# Patient Record
Sex: Female | Born: 1985 | Race: Black or African American | Hispanic: No | Marital: Married | Smoking: Never smoker
Health system: Southern US, Community
[De-identification: ages and names within clinical notes are randomized; demographics above are authoritative.]

## PROBLEM LIST (undated history)

## (undated) DIAGNOSIS — H9190 Unspecified hearing loss, unspecified ear: Secondary | ICD-10-CM

---
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
## 2008-04-17 ENCOUNTER — Encounter: Admission: RE | Admit: 2008-04-17 | Discharge: 2008-04-17 | Payer: Self-pay | Admitting: Family Medicine

## 2008-11-19 ENCOUNTER — Encounter: Admission: RE | Admit: 2008-11-19 | Discharge: 2008-11-19 | Payer: Self-pay | Admitting: Family Medicine

## 2010-06-29 IMAGING — US US SOFT TISSUE HEAD/NECK
1 series · 14 of 25 positions shown · non-contrast
Comparison: [HOSPITAL] chest x-ray 05/10/2003 and

CLINICAL DATA: Thyroid enlargement on physical exam.

THYROID ULTRASOUND
TECHNIQUE: Ultrasound examination of the thyroid gland and
adjacent soft tissues was performed.

[Series 1: us soft tissue head/neck · 0.05mm/px · 14 of 34 slices shown]
[im 1/34]
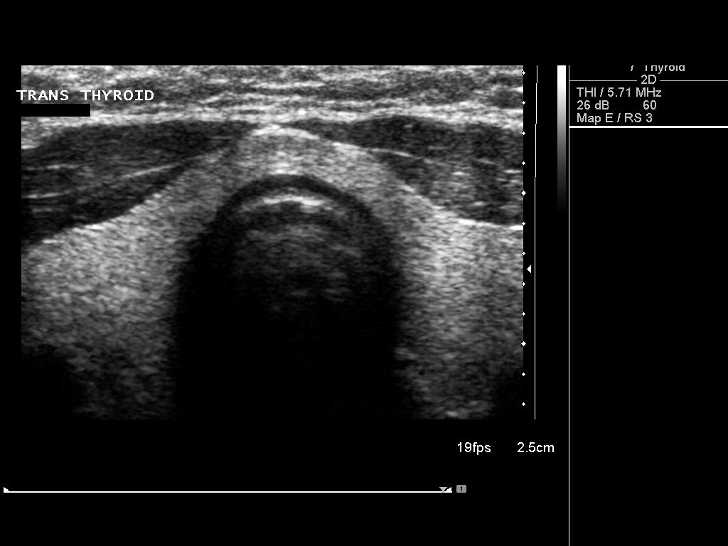
[im 3/34]
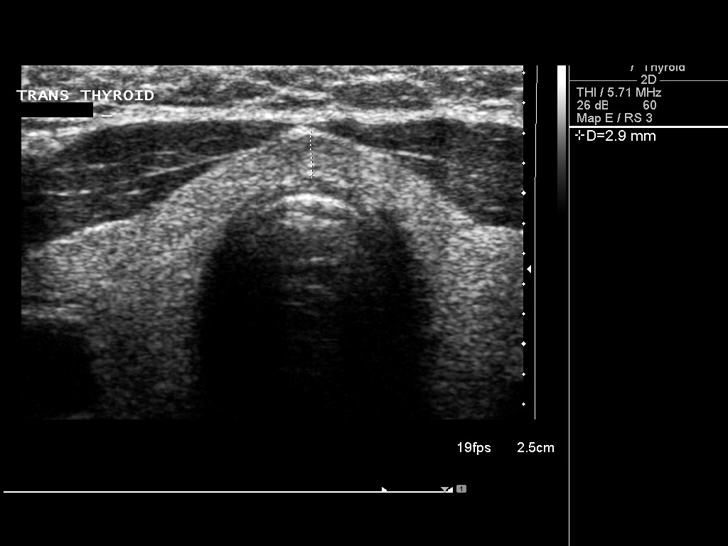
[im 6/34]
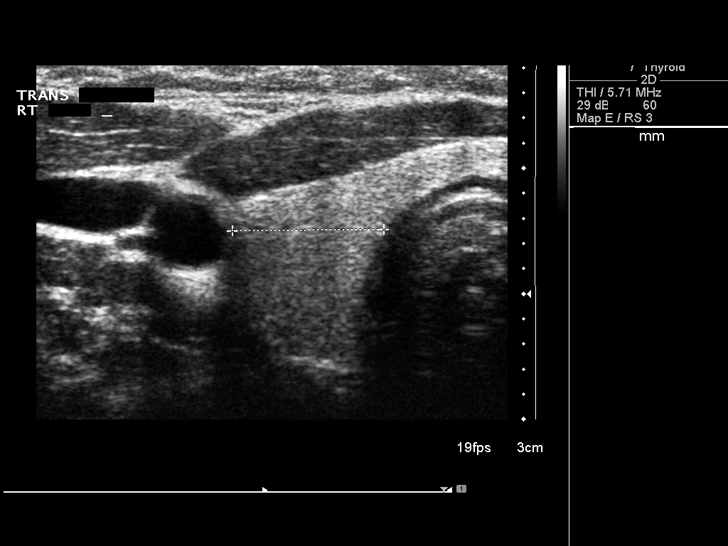
[im 9/34]
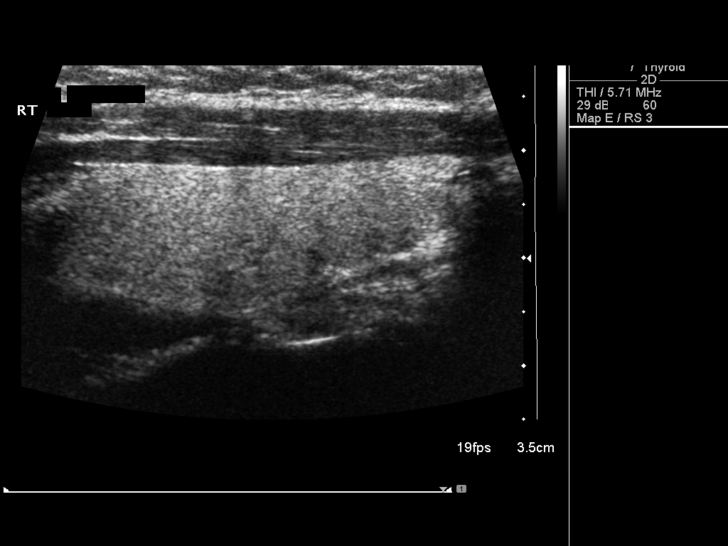
[im 12/34]
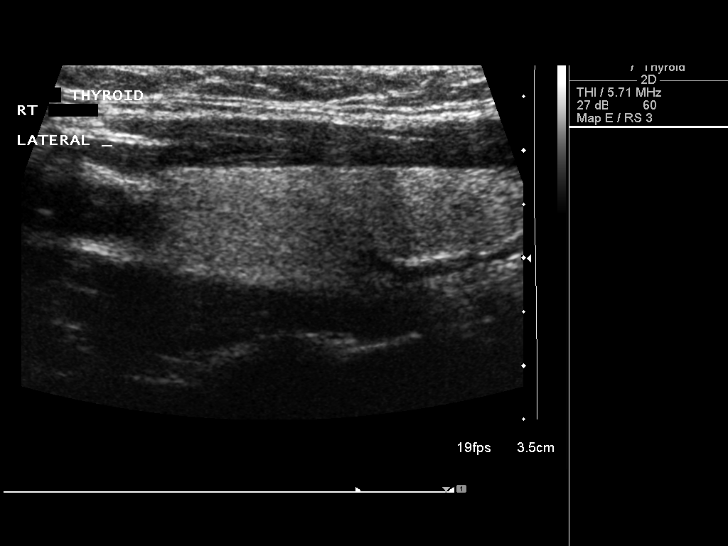
[im 13/34]
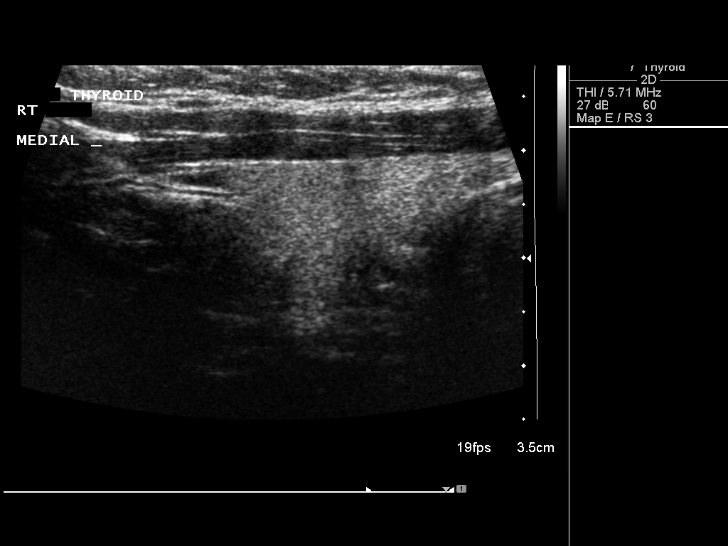
[im 16/34]
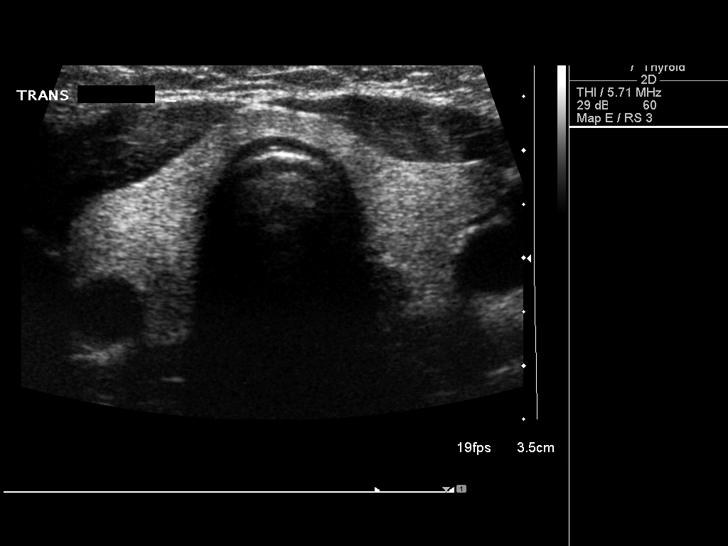
[im 18/34]
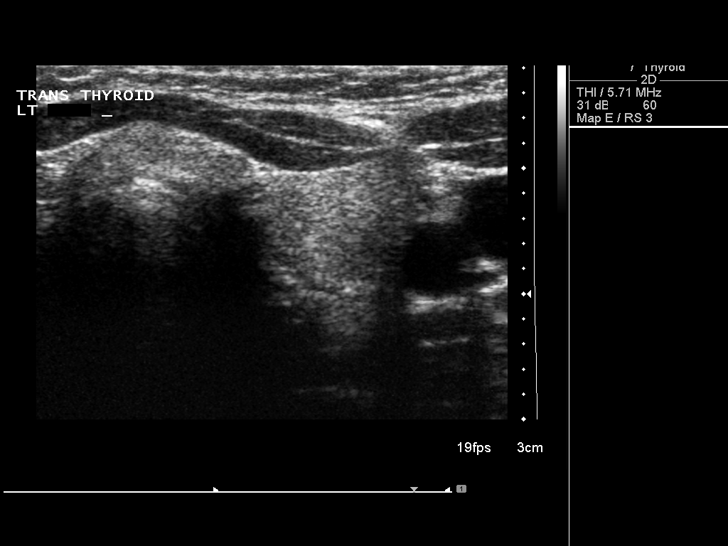
[im 21/34]
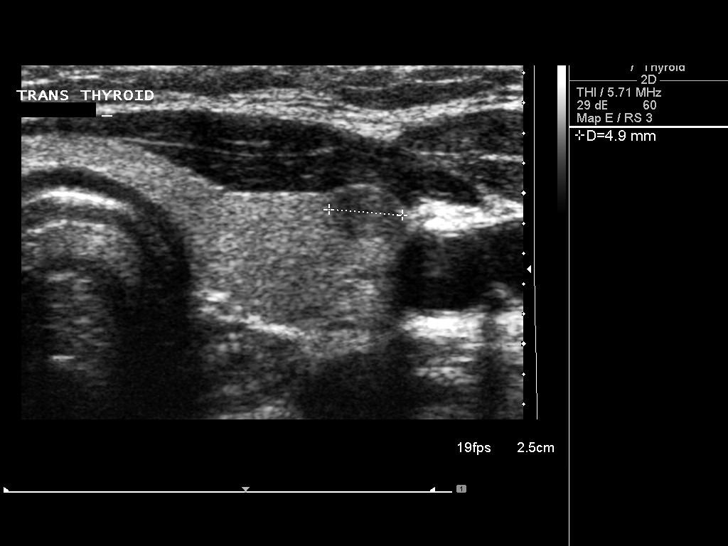
[im 23/34]
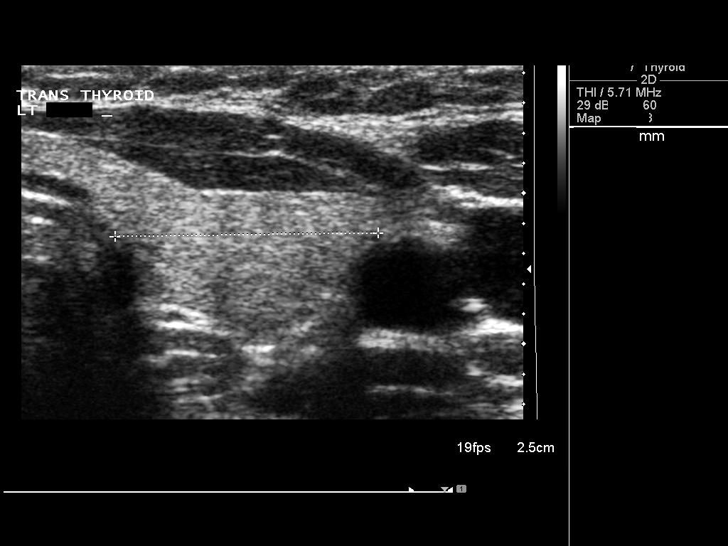
[im 25/34]
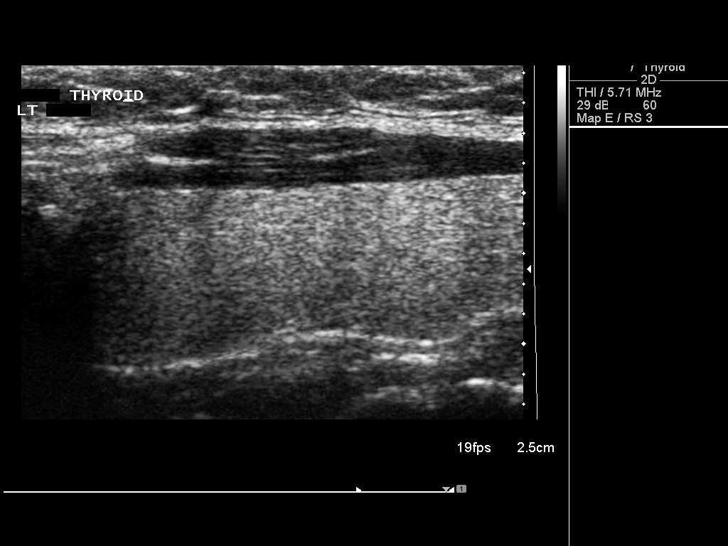
[im 28/34]
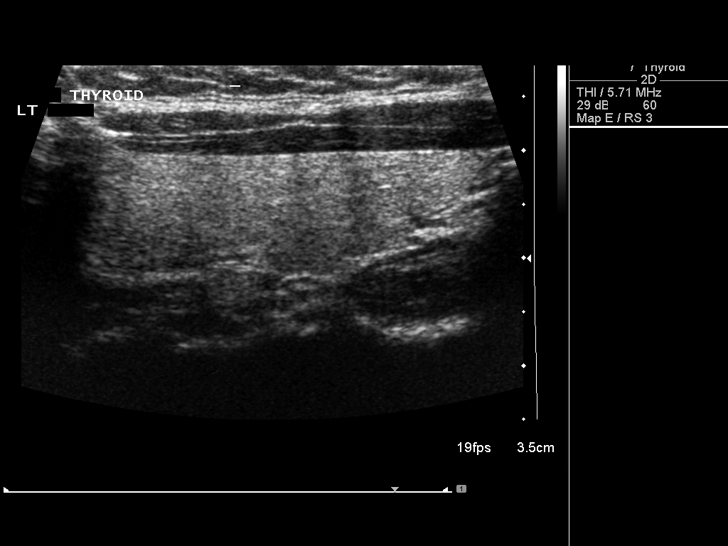
[im 31/34]
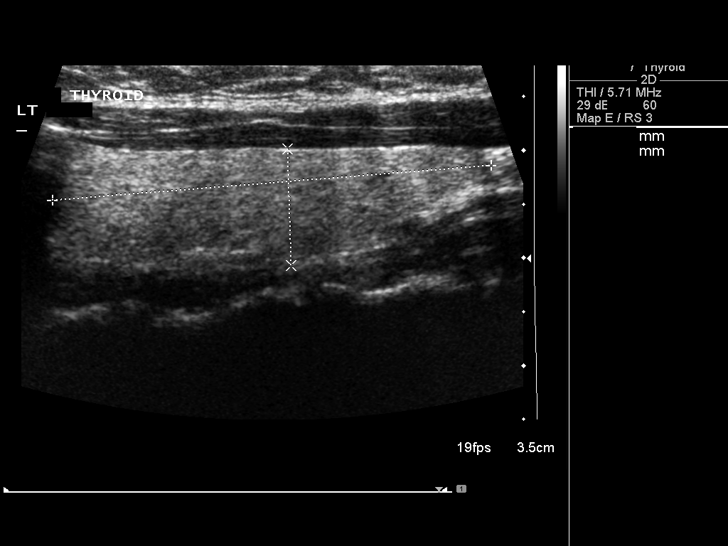
[im 34/34]
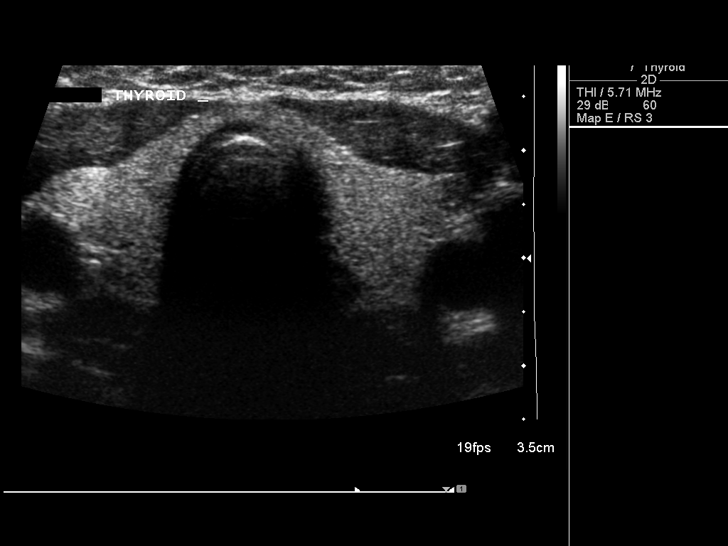

[14 of 25 positions shown; findings below may reference images not displayed]

FINDINGS: Thyroid gland is normal in size with right lobe measuring
4.4 cm long X 1.4 cm AP X 1.2 cm wide and left lobe 4.2 cm long X
1.1 cm AP X 1.4 cm wide.  Isthmus measures 3 mm AP thickness.
Thyroid echotexture is homogeneous.  At the mid left thyroid lobe
is solitary hypoechoic 5 mm nodule not visualized on previous neck
CT.
IMPRESSION: 1.  Mid left lobe 5 mm solid solitary nodule - probable benign
thyroid adenoma.
2.  Otherwise, normal.

## 2013-01-20 ENCOUNTER — Emergency Department (INDEPENDENT_AMBULATORY_CARE_PROVIDER_SITE_OTHER)
Admission: EM | Admit: 2013-01-20 | Discharge: 2013-01-20 | Disposition: A | Discharge status: Home / Self Care | Payer: Self-pay | Source: Home / Self Care | Attending: Family Medicine | Admitting: Family Medicine

## 2013-01-20 ENCOUNTER — Encounter (HOSPITAL_COMMUNITY): Payer: Self-pay | Admitting: Emergency Medicine

## 2013-01-20 DIAGNOSIS — R079 Chest pain, unspecified: Secondary | ICD-10-CM

## 2013-01-20 DIAGNOSIS — IMO0001 Reserved for inherently not codable concepts without codable children: Secondary | ICD-10-CM

## 2013-01-20 HISTORY — DX: Unspecified hearing loss, unspecified ear: H91.90
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
## 2013-01-20 NOTE — ED Notes (Signed)
Unable to reach  Deaf  Interpretor  After  Many  Attempts

## 2014-08-09 DIAGNOSIS — M533 Sacrococcygeal disorders, not elsewhere classified: Secondary | ICD-10-CM | POA: Diagnosis not present

## 2014-08-22 ENCOUNTER — Other Ambulatory Visit: Payer: Self-pay | Admitting: Physician Assistant

## 2014-08-22 DIAGNOSIS — M533 Sacrococcygeal disorders, not elsewhere classified: Secondary | ICD-10-CM

## 2014-08-23 ENCOUNTER — Other Ambulatory Visit: Payer: Self-pay

## 2014-10-03 ENCOUNTER — Other Ambulatory Visit: Payer: Self-pay

## 2014-10-03 ENCOUNTER — Ambulatory Visit
Admission: RE | Admit: 2014-10-03 | Discharge: 2014-10-03 | Disposition: A | Discharge status: Home / Self Care | Payer: Medicare Other | Source: Ambulatory Visit | Attending: Physician Assistant | Admitting: Physician Assistant

## 2014-10-03 DIAGNOSIS — M533 Sacrococcygeal disorders, not elsewhere classified: Secondary | ICD-10-CM

## 2014-12-09 ENCOUNTER — Ambulatory Visit: Payer: Medicare Other | Attending: Family Medicine | Admitting: Family Medicine

## 2014-12-09 ENCOUNTER — Encounter: Payer: Self-pay | Admitting: Family Medicine

## 2014-12-09 DIAGNOSIS — H9190 Unspecified hearing loss, unspecified ear: Secondary | ICD-10-CM | POA: Diagnosis not present

## 2014-12-09 DIAGNOSIS — M533 Sacrococcygeal disorders, not elsewhere classified: Secondary | ICD-10-CM | POA: Insufficient documentation

## 2014-12-09 DIAGNOSIS — H905 Unspecified sensorineural hearing loss: Secondary | ICD-10-CM | POA: Insufficient documentation

## 2014-12-09 MED ORDER — IBUPROFEN 600 MG PO TABS: 600.0000 mg | ORAL_TABLET | Freq: Two times a day (BID) | ORAL | Status: AC

## 2014-12-09 MED ORDER — NAPROXEN 500 MG PO TABS: 500.0000 mg | ORAL_TABLET | Freq: Two times a day (BID) | ORAL | Status: DC

## 2014-12-09 MED ORDER — IBUPROFEN 200 MG PO TABS
600.0000 mg | ORAL_TABLET | Freq: Once | ORAL | Status: AC
  Administered 2014-12-09: 600 mg via ORAL

## 2014-12-09 NOTE — Assessment & Plan Note (Signed)
Treatments for coccydynia are usually noninvasive and local. The first line of treatment typically includes: Non-steroidal anti-inflammatory drugs (NSAIDs). Common NSAIDs, such as ibuprofen, naproxen and COX-2 inhibitors, help reduce the inflammation around the coccyx that is usually a cause of the pain.  Applying ice or a cold pack to the area several times a day for the first few days after the pain starts.  Applying heat or a hot pack to the area after the first few days.  Avoiding sitting for prolonged periods, or placing any pressure on the area, as much as possible.  A custom pillow to help take pressure off the coccyx when sitting. Some find a donut-shaped pillow works well, and for others it is not the right shape and still puts pressure on the coccyx. Many prefer a foam pillow that is more of a U-shape or V-shape (with the back open so nothing touches the coccyx). Any type of pillow or sitting arrangement that keeps pressure off the coccyx is ideal.  If the tailbone pain is caused or increased with bowel movements or constipation, then stool softeners and increased fiber and water intake is recommended.   F./u in 4 weeks for coccyx pain

## 2014-12-09 NOTE — Progress Notes (Signed)
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

## 2014-12-09 NOTE — Patient Instructions (Addendum)
Elli was seen today for tailbone pain.  Diagnoses and all orders for this visit:  Coccyx pain -     naproxen (NAPROSYN) 500 MG tablet; Take 1 tablet (500 mg total) by mouth 2 (two) times daily with a meal.    Treatments for coccydynia are usually noninvasive and local. The first line of treatment typically includes: Non-steroidal anti-inflammatory drugs (NSAIDs). Common NSAIDs, such as ibuprofen, naproxen and COX-2 inhibitors, help reduce the inflammation around the coccyx that is usually a cause of the pain.  Applying ice or a cold pack to the area several times a day for the first few days after the pain starts.  Applying heat or a hot pack to the area after the first few days.  Avoiding sitting for prolonged periods, or placing any pressure on the area, as much as possible.  A custom pillow to help take pressure off the coccyx when sitting. Some find a donut-shaped pillow works well, and for others it is not the right shape and still puts pressure on the coccyx. Many prefer a foam pillow that is more of a U-shape or V-shape (with the back open so nothing touches the coccyx). Any type of pillow or sitting arrangement that keeps pressure off the coccyx is ideal.  If the tailbone pain is caused or increased with bowel movements or constipation, then stool softeners and increased fiber and water intake is recommended.   F./u in 4 weeks for coccyx pain  Dr. Armen PickupFunches

## 2014-12-09 NOTE — Progress Notes (Signed)
   Subjective:  Patient ID: Norma Wolfe, female    DOB: [birth year]  Age: 29 y.o. MRN: [id number] ASL interpreter used  CC: Tailbone Pain   HPI [name] presents for   1. Tailbone pain: since June 2016. No injury. No fall. No hx of chronic pain. Pain is midline. Pain is worse with prolonged sitting, doing sit ups and rising from standing. No rash. Pain does not radiate. No weakness in legs. Hot baths help with pain. She has tried naproxen but this caused "shortness of breath at night". She can tolerate ibuprofen. 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   Past Medical History  Diagnosis Date  . Deaf     No past surgical history on file.  No family history on file.  Social History  Substance Use Topics  . Smoking status: Never Smoker   . Smokeless tobacco: Not on file  . Alcohol Use: No    ROS Review of Systems  Constitutional: Negative for fever and chills.  Eyes: Negative for visual disturbance.  Respiratory: Negative for shortness of breath.   Cardiovascular: Negative for chest pain.  Gastrointestinal: Negative for abdominal pain and blood in stool.  Musculoskeletal: Positive for myalgias. Negative for back pain, joint swelling, arthralgias and gait problem.  Skin: Negative for rash.  Allergic/Immunologic: Negative for immunocompromised state.  Hematological: Negative for adenopathy. Does not bruise/bleed easily.  Psychiatric/Behavioral: Negative for suicidal ideas and dysphoric mood.    Objective:   Today's Vitals: BP 138/91 mmHg  Pulse 88  Temp(Src) 99.2 F (37.3 C) (Oral)  Resp 16  Ht 5\' 1"  (1.549 m)  Wt 191 lb (86.637 kg)  BMI 36.11 kg/m2  SpO2 100%  LMP 10/29/2014  Physical Exam  Constitutional: She is oriented to person, place, and time. She appears well-developed and well-nourished. No distress.  HENT:  Head: Normocephalic and atraumatic.  Cardiovascular: Normal rate, regular rhythm, normal heart sounds and intact  distal pulses.   Pulmonary/Chest: Effort normal and breath sounds normal.  Musculoskeletal: She exhibits no edema.       Back:  Neurological: She is alert and oriented to person, place, and time.  Skin: Skin is warm and dry. No rash noted.  Psychiatric: She has a normal mood and affect.    Assessment & Plan:   Kaisa was seen today for tailbone pain.  Diagnoses and all orders for this visit:  Coccyx pain -     Discontinue: naproxen (NAPROSYN) 500 MG tablet; Take 1 tablet (500 mg total) by mouth 2 (two) times daily with a meal. -     ibuprofen (ADVIL,MOTRIN) 600 MG tablet; Take 1 tablet (600 mg total) by mouth 2 (two) times daily with a meal. -     ibuprofen (ADVIL,MOTRIN) tablet 600 mg; Take 3 tablets (600 mg total) by mouth once.   Patient treated with ibuprofen for 15 minutes, observed in office. Tolerated ibuprofen.   No outpatient encounter prescriptions on file as of 12/09/2014.   No facility-administered encounter medications on file as of 12/09/2014.    Follow-up: No Follow-up on file.    Dessa PhiJosalyn Melia Hopes MD

## 2015-08-06 DIAGNOSIS — S339XXA Sprain of unspecified parts of lumbar spine and pelvis, initial encounter: Secondary | ICD-10-CM | POA: Diagnosis not present

## 2015-09-30 DIAGNOSIS — H9190 Unspecified hearing loss, unspecified ear: Secondary | ICD-10-CM | POA: Diagnosis not present

## 2015-09-30 DIAGNOSIS — Z8639 Personal history of other endocrine, nutritional and metabolic disease: Secondary | ICD-10-CM | POA: Diagnosis not present

## 2015-09-30 DIAGNOSIS — Z0000 Encounter for general adult medical examination without abnormal findings: Secondary | ICD-10-CM | POA: Diagnosis not present

## 2015-09-30 DIAGNOSIS — Z136 Encounter for screening for cardiovascular disorders: Secondary | ICD-10-CM | POA: Diagnosis not present

## 2015-09-30 DIAGNOSIS — E559 Vitamin D deficiency, unspecified: Secondary | ICD-10-CM | POA: Diagnosis not present

## 2015-10-02 ENCOUNTER — Other Ambulatory Visit: Payer: Self-pay | Admitting: Family Medicine

## 2015-10-02 DIAGNOSIS — Z8639 Personal history of other endocrine, nutritional and metabolic disease: Secondary | ICD-10-CM

## 2015-12-02 DIAGNOSIS — J209 Acute bronchitis, unspecified: Secondary | ICD-10-CM | POA: Diagnosis not present

## 2016-05-12 IMAGING — CR DG SACRUM/COCCYX 2+V
3 series · 3 of 3 positions shown · non-contrast
Comparison: None.

CLINICAL DATA: Sacrococcygeal pain x2 months, no known injury

EXAM:
SACRUM AND COCCYX - 2+ VIEW

[t sacrum a.p.]
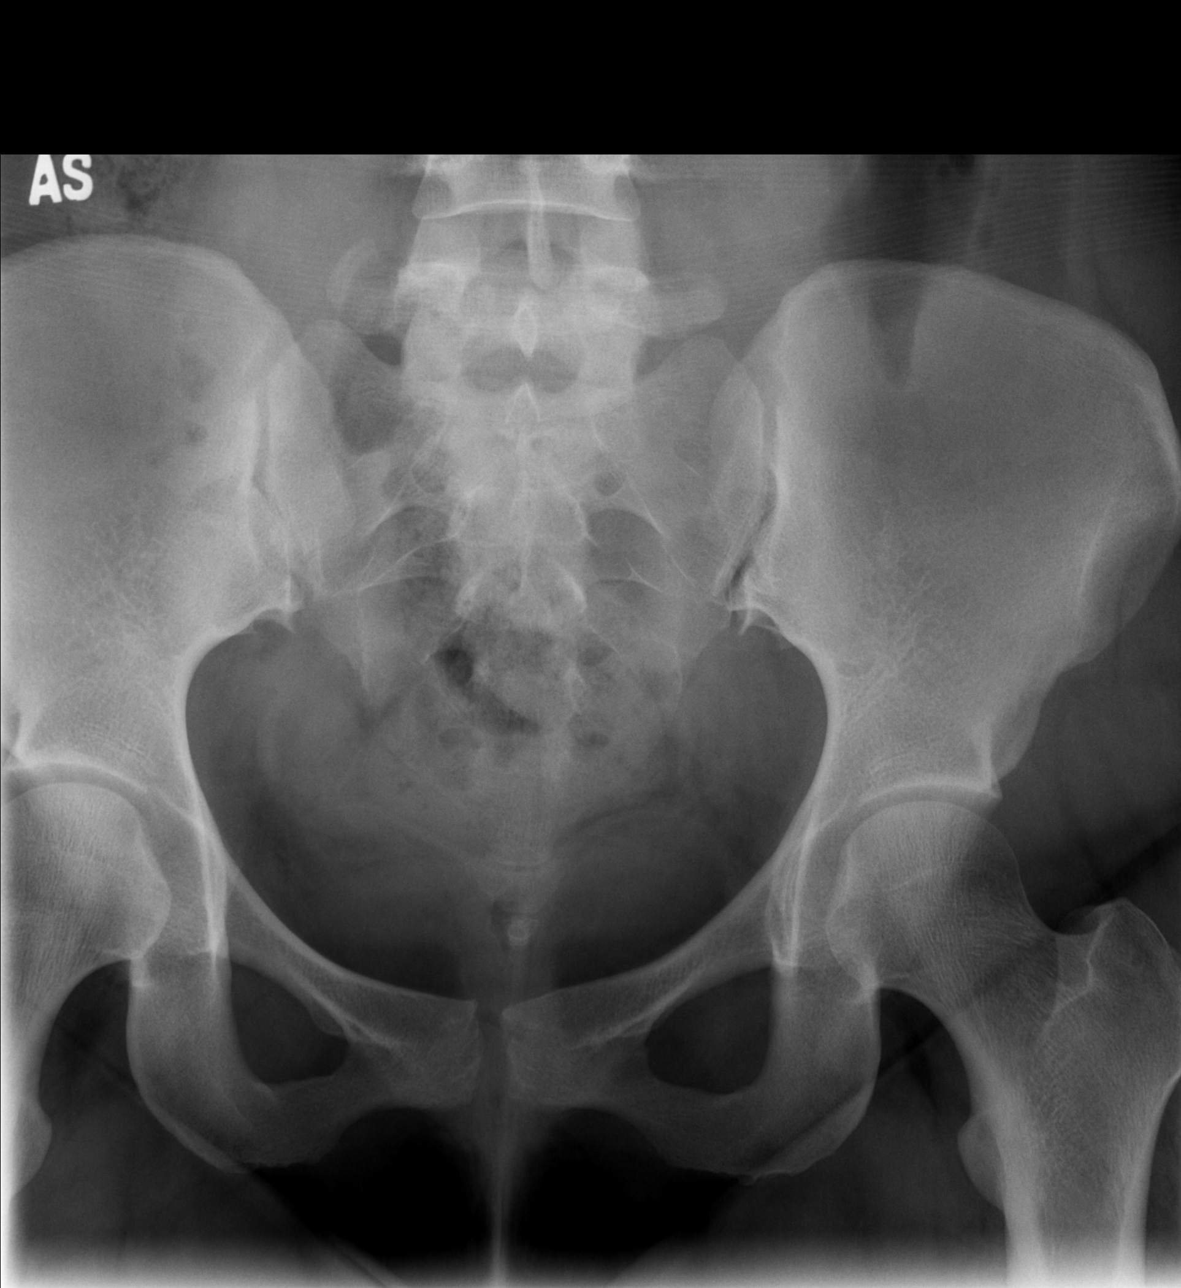

[t coccyx a.p.]
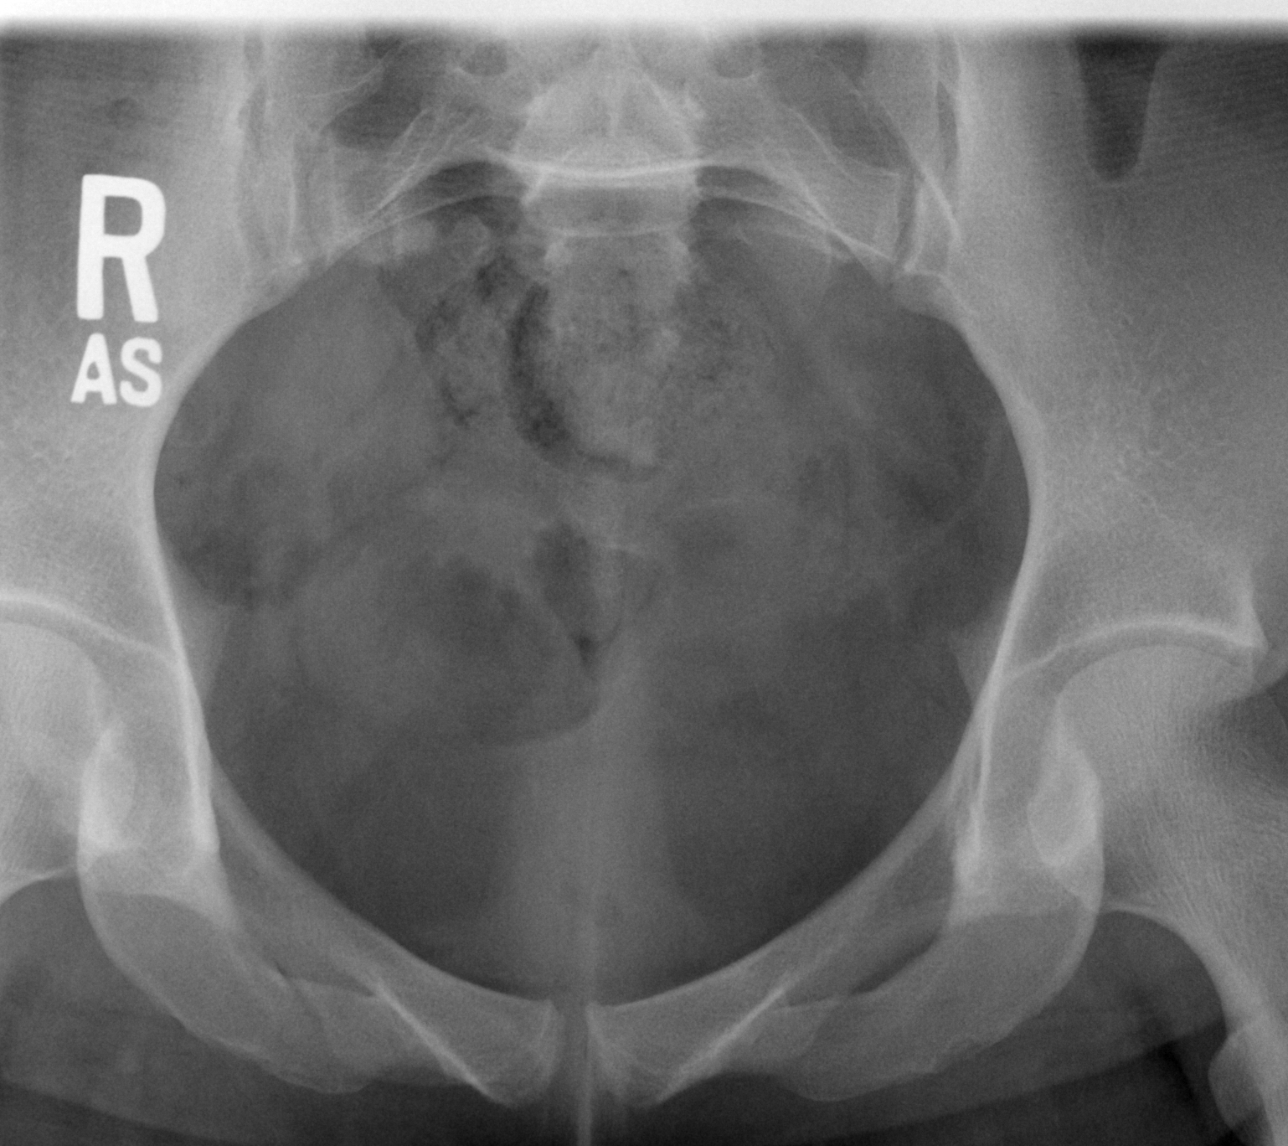

[t coccyx lat]
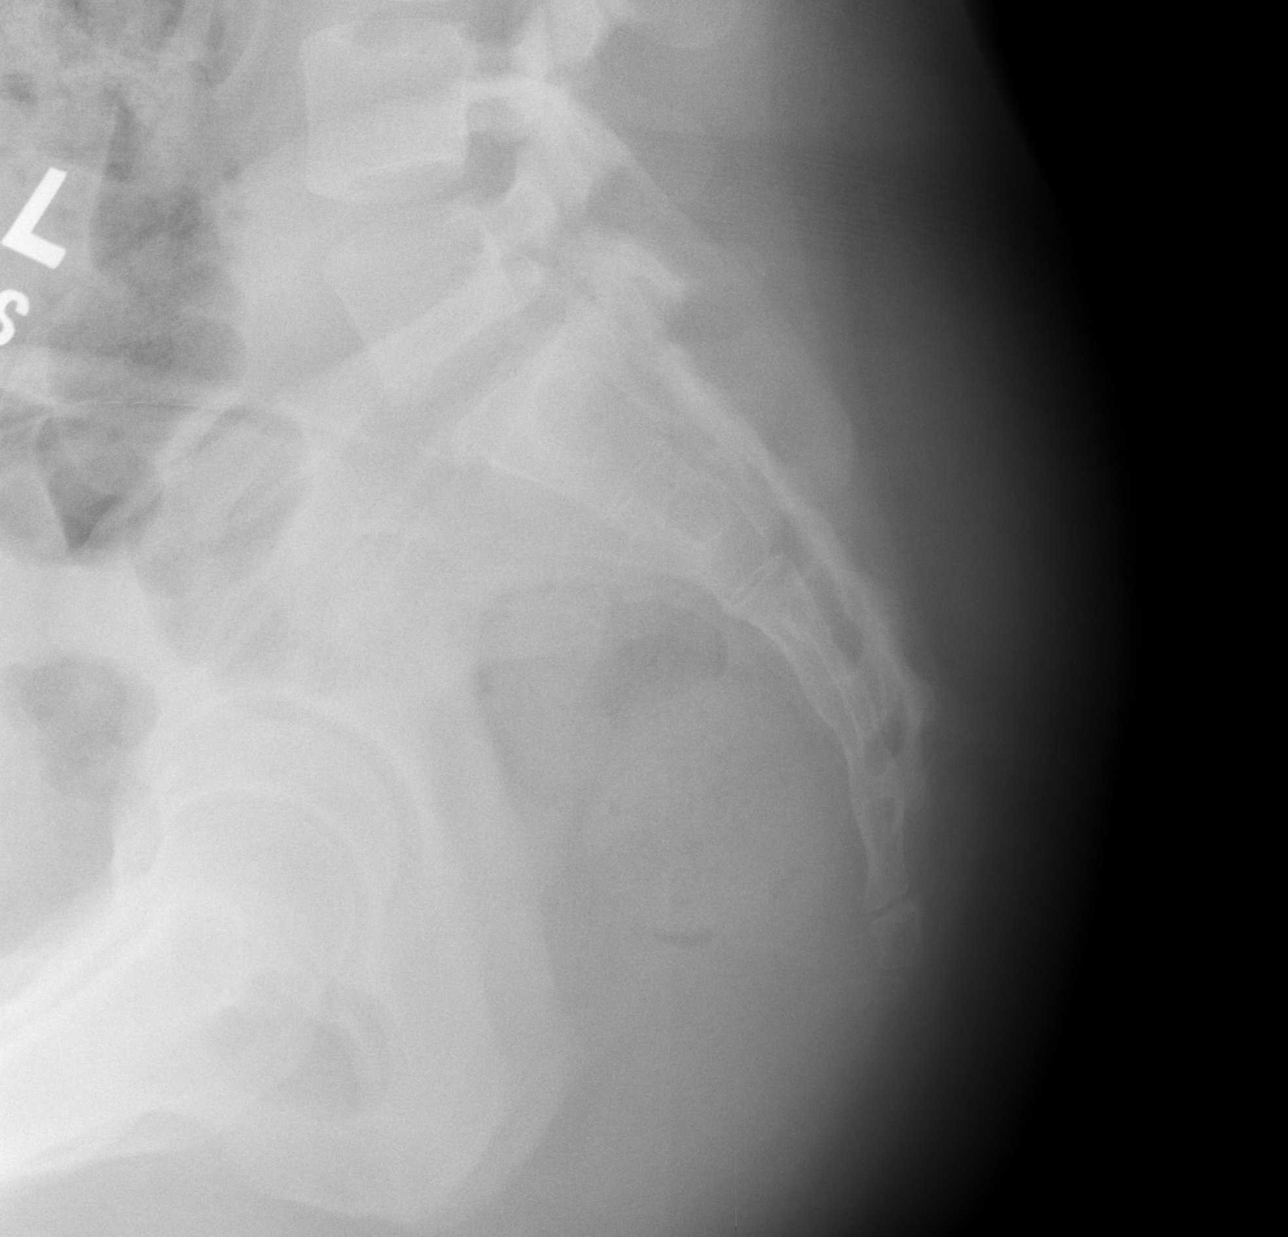

[3 of 3 positions shown; findings below may reference images not displayed]

FINDINGS: No displaced sacrococcygeal fracture is seen.

Visualized bony pelvis appears intact.

Bilobed hip joint spaces are preserved.

Lower lumbar spine is within normal limits.
IMPRESSION: Negative.

## 2016-09-30 DIAGNOSIS — Z136 Encounter for screening for cardiovascular disorders: Secondary | ICD-10-CM | POA: Diagnosis not present

## 2016-09-30 DIAGNOSIS — Z8639 Personal history of other endocrine, nutritional and metabolic disease: Secondary | ICD-10-CM | POA: Diagnosis not present

## 2016-09-30 DIAGNOSIS — Z1389 Encounter for screening for other disorder: Secondary | ICD-10-CM | POA: Diagnosis not present

## 2016-09-30 DIAGNOSIS — Z0000 Encounter for general adult medical examination without abnormal findings: Secondary | ICD-10-CM | POA: Diagnosis not present

## 2016-09-30 DIAGNOSIS — N6011 Diffuse cystic mastopathy of right breast: Secondary | ICD-10-CM | POA: Diagnosis not present

## 2016-09-30 DIAGNOSIS — E559 Vitamin D deficiency, unspecified: Secondary | ICD-10-CM | POA: Diagnosis not present

## 2016-09-30 DIAGNOSIS — Z13220 Encounter for screening for lipoid disorders: Secondary | ICD-10-CM | POA: Diagnosis not present

## 2016-09-30 DIAGNOSIS — H9190 Unspecified hearing loss, unspecified ear: Secondary | ICD-10-CM | POA: Diagnosis not present

## 2016-10-01 ENCOUNTER — Other Ambulatory Visit: Payer: Self-pay | Admitting: Family Medicine

## 2016-10-01 DIAGNOSIS — Z8639 Personal history of other endocrine, nutritional and metabolic disease: Secondary | ICD-10-CM

## 2018-01-03 DIAGNOSIS — N3001 Acute cystitis with hematuria: Secondary | ICD-10-CM | POA: Diagnosis not present

## 2018-07-18 DIAGNOSIS — R399 Unspecified symptoms and signs involving the genitourinary system: Secondary | ICD-10-CM | POA: Diagnosis not present

## 2018-12-18 ENCOUNTER — Other Ambulatory Visit: Payer: Self-pay

## 2018-12-18 DIAGNOSIS — Z20822 Contact with and (suspected) exposure to covid-19: Secondary | ICD-10-CM

## 2018-12-19 LAB — NOVEL CORONAVIRUS, NAA: SARS-CoV-2, NAA: DETECTED — AB
